# Patient Record
Sex: Female | Born: 1975 | Race: White | Hispanic: No | Smoking: Current every day smoker
Health system: Southern US, Academic
[De-identification: ages and names within clinical notes are randomized; demographics above are authoritative.]

## PROBLEM LIST (undated history)

## (undated) DIAGNOSIS — K219 Gastro-esophageal reflux disease without esophagitis: Secondary | ICD-10-CM

## (undated) DIAGNOSIS — I714 Abdominal aortic aneurysm, without rupture, unspecified (CMS HCC): Secondary | ICD-10-CM

## (undated) DIAGNOSIS — J449 Chronic obstructive pulmonary disease, unspecified: Secondary | ICD-10-CM

## (undated) DIAGNOSIS — I240 Acute coronary thrombosis not resulting in myocardial infarction: Secondary | ICD-10-CM

## (undated) DIAGNOSIS — M1990 Unspecified osteoarthritis, unspecified site: Secondary | ICD-10-CM

## (undated) DIAGNOSIS — M797 Fibromyalgia: Secondary | ICD-10-CM

## (undated) DIAGNOSIS — I749 Embolism and thrombosis of unspecified artery: Secondary | ICD-10-CM

## (undated) HISTORY — DX: Chronic obstructive pulmonary disease, unspecified: J44.9

## (undated) HISTORY — DX: Fibromyalgia: M79.7

## (undated) HISTORY — DX: Gastro-esophageal reflux disease without esophagitis: K21.9

## (undated) HISTORY — PX: VASCULAR SURGERY: SHX849

## (undated) HISTORY — DX: Embolism and thrombosis of unspecified artery (CMS HCC): I74.9

## (undated) HISTORY — PX: HX HYSTERECTOMY: SHX81

## (undated) HISTORY — PX: CARDIAC CATHETERIZATION: SHX172

## (undated) HISTORY — PX: HX GALL BLADDER SURGERY/CHOLE: SHX55

## (undated) HISTORY — DX: Chronic obstructive pulmonary disease, unspecified (CMS HCC): J44.9

## (undated) HISTORY — DX: Acute coronary thrombosis not resulting in myocardial infarction (CMS HCC): I24.0

## (undated) HISTORY — DX: Unspecified osteoarthritis, unspecified site: M19.90

## (undated) HISTORY — DX: Abdominal aortic aneurysm, without rupture, unspecified (CMS HCC): I71.40

---
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
## 2020-05-20 DIAGNOSIS — I743 Embolism and thrombosis of arteries of the lower extremities: Secondary | ICD-10-CM | POA: Insufficient documentation

## 2020-05-20 DIAGNOSIS — F17200 Nicotine dependence, unspecified, uncomplicated: Secondary | ICD-10-CM | POA: Insufficient documentation

## 2020-06-16 DIAGNOSIS — I739 Peripheral vascular disease, unspecified: Secondary | ICD-10-CM | POA: Insufficient documentation

## 2020-09-26 ENCOUNTER — Emergency Department (HOSPITAL_COMMUNITY)

## 2020-09-26 ENCOUNTER — Emergency Department (HOSPITAL_COMMUNITY)
Admission: EM | Admit: 2020-09-26 | Discharge: 2020-09-26 | Disposition: A | Discharge status: Home / Self Care | Attending: Emergency Medicine | Admitting: Emergency Medicine

## 2020-09-26 ENCOUNTER — Other Ambulatory Visit: Payer: Self-pay

## 2020-09-26 ENCOUNTER — Encounter (HOSPITAL_COMMUNITY): Payer: Self-pay

## 2020-09-26 DIAGNOSIS — S8001XA Contusion of right knee, initial encounter: Secondary | ICD-10-CM | POA: Diagnosis not present

## 2020-09-26 DIAGNOSIS — Y92410 Unspecified street and highway as the place of occurrence of the external cause: Secondary | ICD-10-CM | POA: Insufficient documentation

## 2020-09-26 DIAGNOSIS — R062 Wheezing: Secondary | ICD-10-CM

## 2020-09-26 DIAGNOSIS — R519 Headache, unspecified: Secondary | ICD-10-CM | POA: Diagnosis not present

## 2020-09-26 DIAGNOSIS — M79602 Pain in left arm: Secondary | ICD-10-CM | POA: Diagnosis not present

## 2020-09-26 DIAGNOSIS — S39012A Strain of muscle, fascia and tendon of lower back, initial encounter: Secondary | ICD-10-CM

## 2020-09-26 DIAGNOSIS — S8991XA Unspecified injury of right lower leg, initial encounter: Secondary | ICD-10-CM | POA: Diagnosis present

## 2020-09-26 DIAGNOSIS — R0781 Pleurodynia: Secondary | ICD-10-CM | POA: Diagnosis not present

## 2020-09-26 DIAGNOSIS — J449 Chronic obstructive pulmonary disease, unspecified: Secondary | ICD-10-CM | POA: Diagnosis not present

## 2020-09-26 MED ORDER — ALBUTEROL SULFATE HFA 108 (90 BASE) MCG/ACT IN AERS
4.0000 | INHALATION_SPRAY | Freq: Once | RESPIRATORY_TRACT | Status: AC
  Administered 2020-09-26: 05:00:00 4 via RESPIRATORY_TRACT
  Filled 2020-09-26: qty 6.7

## 2020-09-26 MED ORDER — ACETAMINOPHEN 325 MG PO TABS
650.0000 mg | ORAL_TABLET | Freq: Once | ORAL | Status: AC
  Administered 2020-09-26: 05:00:00 650 mg via ORAL
  Filled 2020-09-26: qty 2
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
## 2020-10-04 ENCOUNTER — Encounter (HOSPITAL_COMMUNITY): Payer: Self-pay | Admitting: Emergency Medicine

## 2020-10-04 ENCOUNTER — Other Ambulatory Visit: Payer: Self-pay

## 2020-10-04 ENCOUNTER — Emergency Department (HOSPITAL_COMMUNITY)
Admission: EM | Admit: 2020-10-04 | Discharge: 2020-10-04 | Disposition: A | Discharge status: Home / Self Care | Attending: Emergency Medicine | Admitting: Emergency Medicine

## 2020-10-04 DIAGNOSIS — J449 Chronic obstructive pulmonary disease, unspecified: Secondary | ICD-10-CM | POA: Diagnosis not present

## 2020-10-04 DIAGNOSIS — L03115 Cellulitis of right lower limb: Secondary | ICD-10-CM | POA: Diagnosis not present

## 2020-10-04 DIAGNOSIS — M25561 Pain in right knee: Secondary | ICD-10-CM | POA: Diagnosis present

## 2020-10-04 MED ORDER — CEPHALEXIN 500 MG PO CAPS: 500.0000 mg | ORAL_CAPSULE | Freq: Four times a day (QID) | ORAL | 0 refills | Status: AC
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
## 2020-10-04 NOTE — Discharge Instructions (Addendum)
Call your primary care doctor or specialist as discussed in the next 4-5  days.   °Return immediately back to the ER if: ° °Your symptoms worsen within the next 12-24 hours. °You develop new symptoms such as new fevers, persistent vomiting, new pain, shortness of breath, or new weakness or numbness, or if you have any other concerns. ° °

## 2021-12-13 ENCOUNTER — Encounter (INDEPENDENT_AMBULATORY_CARE_PROVIDER_SITE_OTHER): Payer: Self-pay | Admitting: OTOLARYNGOLOGY

## 2021-12-20 ENCOUNTER — Encounter (INDEPENDENT_AMBULATORY_CARE_PROVIDER_SITE_OTHER): Payer: Self-pay | Admitting: OTOLARYNGOLOGY

## 2021-12-31 ENCOUNTER — Telehealth (INDEPENDENT_AMBULATORY_CARE_PROVIDER_SITE_OTHER): Payer: Self-pay | Admitting: OTOLARYNGOLOGY
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
## 2021-12-31 NOTE — Telephone Encounter (Signed)
I called and spoke with Miranda about pt and the upcoming surgery. She stated that the patient was not able to have the cardiac stress or labs done on 12/24/21. She is currently incarcerated and will have to wait until she is out to proceed with testing and surgery. Jules Husbands  12/31/2021, 15:24

## 2022-01-02 ENCOUNTER — Inpatient Hospital Stay (HOSPITAL_COMMUNITY): Admission: RE | Admit: 2022-01-02 | Payer: BC Managed Care – PPO | Source: Ambulatory Visit | Admitting: OTOLARYNGOLOGY

## 2022-01-02 ENCOUNTER — Encounter (HOSPITAL_COMMUNITY): Admission: RE | Payer: Self-pay | Source: Ambulatory Visit

## 2022-01-02 SURGERY — MICROLARYNGOSCOPY WITH BIOPSY
Site: Throat

## 2022-01-17 NOTE — Telephone Encounter (Signed)
Called and spoke with [facility] nurse about setting patient up for a follow up appointment, she advised that the facility doctor would have to approve first. She will call me back. Michelle Tucker  01/17/2022, 09:57

## 2022-06-25 ENCOUNTER — Encounter (INDEPENDENT_AMBULATORY_CARE_PROVIDER_SITE_OTHER): Payer: Self-pay | Admitting: OTOLARYNGOLOGY
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
## 2022-09-26 ENCOUNTER — Encounter (INDEPENDENT_AMBULATORY_CARE_PROVIDER_SITE_OTHER): Payer: BC Managed Care – PPO | Admitting: OTOLARYNGOLOGY

## 2022-09-26 ENCOUNTER — Ambulatory Visit (INDEPENDENT_AMBULATORY_CARE_PROVIDER_SITE_OTHER): Payer: Medicaid Other | Admitting: OTOLARYNGOLOGY

## 2022-09-26 ENCOUNTER — Encounter (INDEPENDENT_AMBULATORY_CARE_PROVIDER_SITE_OTHER): Payer: Self-pay | Admitting: OTOLARYNGOLOGY

## 2022-09-26 ENCOUNTER — Other Ambulatory Visit: Payer: Self-pay

## 2022-09-26 VITALS — Ht 63.0 in | Wt 165.0 lb

## 2022-09-26 DIAGNOSIS — R109 Unspecified abdominal pain: Secondary | ICD-10-CM | POA: Insufficient documentation

## 2022-09-26 DIAGNOSIS — K219 Gastro-esophageal reflux disease without esophagitis: Secondary | ICD-10-CM

## 2022-09-26 DIAGNOSIS — R1310 Dysphagia, unspecified: Secondary | ICD-10-CM

## 2022-09-26 DIAGNOSIS — K529 Noninfective gastroenteritis and colitis, unspecified: Secondary | ICD-10-CM | POA: Insufficient documentation

## 2022-09-26 DIAGNOSIS — J189 Pneumonia, unspecified organism: Secondary | ICD-10-CM | POA: Insufficient documentation

## 2022-09-26 DIAGNOSIS — J351 Hypertrophy of tonsils: Secondary | ICD-10-CM

## 2022-09-26 MED ORDER — MONTELUKAST 10 MG TABLET
10.0000 mg | ORAL_TABLET | Freq: Every day | ORAL | 3 refills | Status: AC
Start: 2022-09-26 — End: ?

## 2022-09-26 MED ORDER — FAMOTIDINE 40 MG TABLET
40.0000 mg | ORAL_TABLET | Freq: Two times a day (BID) | ORAL | 5 refills | Status: DC
Start: 2022-09-26 — End: 2023-05-22

## 2022-09-26 NOTE — Procedures (Signed)
[facility], [facility]  [address]  [place] [address]    Procedure Note    Name: Michelle Tucker MRN:  [id number]   Date: 09/26/2022 Age: 46 y.o.  DOB:   1976-02-12       [address]75 - LARYNGOSCOPY, FLEXIBLE DIAGNOSTIC (AMB ONLY)    Performed by: Conchita Paris, DO  Authorized by: Conchita Paris, DO    Time Out:     Immediately before the procedure, a time out was called:  Yes    Patient verified:  Yes    Procedure Verified:  Yes    Site Verified:  Yes  Documentation:      Indications for procedure: Throat pain    Anesthesia: Oxymetazoline nasal spray    Description: The flexible endoscope was gently introduced into the nostril and passed along the floor of the nose to the nasopharynx. Adenoid was minimal and eustachian tubes normal. The retropalatal airway was patent.    The endoscope was passed to the oropharynx. Base of tongue displayed normal lingual tonsils, patent valelulla, and sharply defined upright epiglottis. Retrolingual airway was patent.    The larynx displayed normal true vocal cords with good mobility. False cords were normal. Arytenoid mucosa was pink with no edema.     The piriform recesses were symmetric without secretion. The hypopharynx was symmetric without lesion.    Findings: Laryngopharyngeal Reflux, lingual tonsil hypertrophy, no discrete mass.     The patient tolerated the procedure well.                 Conchita Paris, DO

## 2022-09-26 NOTE — H&P (Signed)
[facility]  ENT, [facility]    Progress Note    Name: Michelle Tucker MRN:  [id number]   Date: 09/26/2022 Age: 46 y.o.          Follow Up      Subjective:   Chief Complaint:   Throat Symptoms (Rc on throat. States continues to have pain in throat. States wanting to get biopsy rescheduled. States having swelling in throat. Was unable to get previous done due to being in jail.)       History of Present Illness:  Michelle L Porrata is a 46 y.o. old female who presents to the clinic for follow-up. 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  She was scheduled for biopsy.  She was not able to have surgery. She does report sorethroat and reflux.  She denies any weight loss are neck masses.     Review of Systems   Constitutional: Negative.         Physical Exam:     Vitals:    09/26/22 1002   Weight: 74.8 kg (165 lb)   Height: 1.6 m (5\' 3" )   BMI: 29.29      ENT Physical Exam  Constitutional  Appearance: patient appears well-developed, well-nourished and well-groomed,  Communication/Voice: communication appropriate for developmental age; vocal quality normal;  Head and Face  Appearance: head appears normal, face appears normal and face appears atraumatic;  Palpation: facial palpation normal;  Salivary: glands normal;  Ear  Hearing: intact;  Auricles: right auricle normal; left auricle normal;  External Mastoids: right external mastoid normal; left external mastoid normal;  Ear Canals: right ear canal normal; left ear canal normal;  Tympanic Membranes: right tympanic membrane normal; left tympanic membrane normal;  Nose  External Nose: nares patent bilaterally; external nose normal;  Internal Nose: nasal mucosa normal; septum normal; bilateral inferior turbinates normal;  Oral Cavity/Oropharynx  Lips: normal;  Teeth: normal;  Gums: gingiva normal;  Tongue: normal;  Oral mucosa: normal;  Hard palate: normal;  Neck  Neck: neck normal; neck palpation normal;  Thyroid: thyroid  normal;  Respiratory  Inspection: breathing unlabored; normal breathing rate;  Lymphatic  Palpation: lymph nodes normal;  Neurovestibular  Mental Status: alert and oriented;  Psychiatric: mood normal; affect is appropriate;  Cranial Nerves: cranial nerves intact;       Assessment and Plan:       ICD-10-CM    1. 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 Odynophagia  R13.10 31575 - LARYNGOSCOPY, FLEXIBLE DIAGNOSTIC (AMB ONLY)     CT SOFT TISSUE NECK W IV CONTRAST      3. 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     Follow up:  Return for Follow up after CT.    , DO

## 2022-10-01 ENCOUNTER — Telehealth (INDEPENDENT_AMBULATORY_CARE_PROVIDER_SITE_OTHER): Payer: Self-pay | Admitting: OTOLARYNGOLOGY

## 2022-11-08 ENCOUNTER — Ambulatory Visit (HOSPITAL_COMMUNITY): Payer: Medicaid Other

## 2022-11-08 ENCOUNTER — Other Ambulatory Visit: Payer: Self-pay

## 2022-11-08 ENCOUNTER — Ambulatory Visit
Admission: RE | Admit: 2022-11-08 | Discharge: 2022-11-08 | Disposition: A | Discharge status: Home / Self Care | Payer: Medicaid Other | Source: Ambulatory Visit | Attending: OTOLARYNGOLOGY | Admitting: OTOLARYNGOLOGY

## 2022-11-08 DIAGNOSIS — R1310 Dysphagia, unspecified: Secondary | ICD-10-CM | POA: Insufficient documentation

## 2022-11-08 DIAGNOSIS — J351 Hypertrophy of tonsils: Secondary | ICD-10-CM | POA: Insufficient documentation

## 2022-11-08 MED ORDER — IOHEXOL 350 MG IODINE/ML INTRAVENOUS SOLUTION
75.0000 mL | INTRAVENOUS | Status: AC
Start: 2022-11-08 — End: 2022-11-08
  Administered 2022-11-08: 75 mL via INTRAVENOUS

## 2022-11-19 ENCOUNTER — Encounter (INDEPENDENT_AMBULATORY_CARE_PROVIDER_SITE_OTHER): Payer: Self-pay | Admitting: OTOLARYNGOLOGY

## 2022-12-10 ENCOUNTER — Encounter (INDEPENDENT_AMBULATORY_CARE_PROVIDER_SITE_OTHER): Payer: Self-pay | Admitting: OTOLARYNGOLOGY

## 2022-12-10 ENCOUNTER — Other Ambulatory Visit: Payer: Self-pay

## 2022-12-10 ENCOUNTER — Ambulatory Visit (INDEPENDENT_AMBULATORY_CARE_PROVIDER_SITE_OTHER): Payer: Medicaid Other | Admitting: OTOLARYNGOLOGY

## 2022-12-10 VITALS — Ht 63.0 in | Wt 165.0 lb

## 2022-12-10 DIAGNOSIS — R1310 Dysphagia, unspecified: Secondary | ICD-10-CM

## 2022-12-10 DIAGNOSIS — J351 Hypertrophy of tonsils: Secondary | ICD-10-CM

## 2022-12-10 DIAGNOSIS — K219 Gastro-esophageal reflux disease without esophagitis: Secondary | ICD-10-CM
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
## 2022-12-17 ENCOUNTER — Ambulatory Visit (HOSPITAL_COMMUNITY): Payer: Medicaid Other

## 2022-12-23 ENCOUNTER — Encounter (INDEPENDENT_AMBULATORY_CARE_PROVIDER_SITE_OTHER): Payer: Medicaid Other | Admitting: OTOLARYNGOLOGY

## 2023-01-09 ENCOUNTER — Ambulatory Visit (INDEPENDENT_AMBULATORY_CARE_PROVIDER_SITE_OTHER): Payer: Self-pay | Admitting: Surgery

## 2023-01-22 ENCOUNTER — Encounter (INDEPENDENT_AMBULATORY_CARE_PROVIDER_SITE_OTHER): Payer: Self-pay | Admitting: Surgery

## 2023-01-22 ENCOUNTER — Ambulatory Visit (INDEPENDENT_AMBULATORY_CARE_PROVIDER_SITE_OTHER): Payer: Medicaid Other | Admitting: Surgery

## 2023-01-22 ENCOUNTER — Other Ambulatory Visit: Payer: Self-pay

## 2023-01-22 VITALS — BP 132/88 | HR 72 | Temp 97.6°F | Ht 63.0 in | Wt 159.6 lb

## 2023-01-22 DIAGNOSIS — Z86010 Personal history of colonic polyps: Secondary | ICD-10-CM

## 2023-01-22 DIAGNOSIS — K219 Gastro-esophageal reflux disease without esophagitis: Secondary | ICD-10-CM

## 2023-01-22 DIAGNOSIS — Z800 Family history of malignant neoplasm of digestive organs: Secondary | ICD-10-CM

## 2023-01-22 DIAGNOSIS — R1310 Dysphagia, unspecified: Secondary | ICD-10-CM

## 2023-01-22 MED ORDER — PEG 3350-ELECTROLYTES 236 GRAM-22.74 GRAM-6.74 GRAM-5.86 GRAM SOLUTION
4.0000 L | Freq: Once | ORAL | 0 refills | Status: AC
Start: 2023-01-22 — End: 2023-01-22

## 2023-01-25 ENCOUNTER — Encounter (INDEPENDENT_AMBULATORY_CARE_PROVIDER_SITE_OTHER): Payer: Self-pay | Admitting: Surgery

## 2023-01-25 NOTE — H&P (Signed)
Office History and Physical      Reason for Visit: EGD    History of Present Illness  Michelle Tucker presents as a referral for evaluation of endoscopy with a history of significant gastroesophageal reflux disease with dysphagia.  Has been evaluated by ENT with possible LPR.  Positive family history of colon cancer in an uncle with personal history of colon polyps in the past with last colonoscopy many years ago.  Currently using a proton pump inhibitor with Pepcid.  Symptoms are worse with oral intake.  Denies weight loss, melena, hematochezia, hematemesis, diarrhea, or change in bowel habits      I have reviewed the patient's provided medical records and diagnostic testing including laboratory values, imaging results, documented encounters and providers notes with all pertinent information noted with respect to today's evaluation serving as unique tests and sources as a component of the medical decision making process for this encounter relevant to the patients independent evaluation by me today.        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)     COPD (chronic obstructive pulmonary disease) (CMS HCC)     Embolism (CMS HCC)     Esophageal reflux     Fibromyalgia          Past Surgical History:   Procedure Laterality Date    CARDIAC CATHETERIZATION      HX CHOLECYSTECTOMY      HX HYSTERECTOMY      VASCULAR SURGERY           Current Outpatient Medications   Medication Sig    atorvastatin (LIPITOR) 40 mg Oral Tablet Take 1 Tablet (40 mg total) by mouth    famotidine (PEPCID) 40 mg Oral Tablet Take 1 Tablet (40 mg total) by mouth Twice daily    montelukast (SINGULAIR) 10 mg Oral Tablet Take 1 Tablet (10 mg total) by mouth Once a day    pantoprazole (PROTONIX) 40 mg Oral Tablet, Delayed Release (E.C.) Take 1 Tablet (40 mg total) by mouth    ticagrelor (BRILINTA) 90 mg Oral  Tablet Take 1 Tablet (90 mg total) by mouth    XARELTO 20 mg Oral Tablet Take 1 Tablet (20 mg total) by mouth     Allergies   Allergen Reactions    Venom-Honey Bee Anaphylaxis    Sulfa (Sulfonamides) Rash    Ketorolac Tromethamine     Sulfamethoxazole-Trimethoprim      Other Reaction(s): Dermatitis     Family Medical History:       Problem Relation (Age of Onset)    COPD Other    Heart Disease Other            Social History     Tobacco Use    Smoking status: Every Day     Types: Cigarettes    Smokeless tobacco: Never   Substance Use Topics    Alcohol use: Yes    Drug use: Yes     Types: Marijuana        The above documented section regarding past medical, past surgical, family, and social history (PMFSH) has been reviewed and considered and to the best of my knowledge represents a valid and accurate reflection of the patient's previous pertinent experiences documented by multiple providers and participants of the EMR.I cannot attest to all entries  but do no recognize any gross inaccuracies as the data is a common field across all providers  Further history pertinent to the current encounter will be found as referenced       Physical Examination:    Vitals:    01/22/23 1016   BP: 132/88   Pulse: 72   Temp: 36.4 C (97.6 F)   SpO2: 97%   Weight: 72.4 kg (159 lb 9.6 oz)   Height: 1.6 m ( )   BMI: 28.33          General:appropriate for age. in no acute distress.  HEENT:Atraumatic, Normocephalic.   Lungs:Nonlabored breathing with symmetric expansion  Heart:Regular wth respect to rate and rythmn.    Abdomen:Soft. Nontender. Nondistended  Bowel sounds are present. No peritoneal signs  Skin:No Rashes. No ulcers. Skin warm  Psychiatric:Alert and oriented to person, place, and time. affect appropriate        Diagnosis:    ICD-10-CM    1. Gastroesophageal reflux disease  K21.9       2. Odynophagia  R13.10       3. 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 Dysphagia  R13.10       5. History of colon polyps  Z86.010        6. Family history of colon cancer  Z80.0           Plan:    Discussed indications, risks and benefits of colonoscopy and upper endoscopy with the patient.  Discussed the possibility of polypectomy, biopsies, and possible repeat examinations.  Risks include bleeding, sedation risks, possibility of missed diagnosis of polyp or malignancy, and remote possibilities of perforation and death.  All questions were answered and informed consent was clearly obtained      This note may have been partially generated using MModal Fluency Direct system, and there may be some incorrect words, spellings, and punctuation that were not noted in checking the note before saving, though effort was made to avoid such errors.    Clide Dales MD FACS RVT  [facility] -General Surgery

## 2023-05-22 ENCOUNTER — Other Ambulatory Visit (INDEPENDENT_AMBULATORY_CARE_PROVIDER_SITE_OTHER): Payer: Self-pay | Admitting: OTOLARYNGOLOGY

## 2023-06-03 ENCOUNTER — Encounter (HOSPITAL_COMMUNITY): Admission: RE | Payer: Self-pay | Source: Ambulatory Visit

## 2023-06-03 ENCOUNTER — Ambulatory Visit (HOSPITAL_COMMUNITY): Admission: RE | Admit: 2023-06-03 | Payer: Medicaid Other | Source: Ambulatory Visit | Admitting: Surgery

## 2023-06-03 SURGERY — GASTROSCOPY WITH BIOPSY
Anesthesia: General

## 2023-06-27 ENCOUNTER — Other Ambulatory Visit (INDEPENDENT_AMBULATORY_CARE_PROVIDER_SITE_OTHER): Payer: Self-pay | Admitting: OTOLARYNGOLOGY
